# Patient Record
Sex: Female | Born: 1990 | Race: White | Hispanic: No | Marital: Married | Smoking: Never smoker
Health system: Southern US, Community
[De-identification: ages and names within clinical notes are randomized; demographics above are authoritative.]

## PROBLEM LIST (undated history)

## (undated) DIAGNOSIS — F419 Anxiety disorder, unspecified: Secondary | ICD-10-CM

## (undated) DIAGNOSIS — E559 Vitamin D deficiency, unspecified: Secondary | ICD-10-CM

## (undated) HISTORY — PX: OTHER SURGICAL HISTORY: SHX169

## (undated) HISTORY — DX: Vitamin D deficiency, unspecified: E55.9

---
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
## 2011-05-21 LAB — URINALYSIS, ROUTINE W REFLEX MICROSCOPIC
Bilirubin Urine: NEGATIVE
Glucose, UA: NEGATIVE
Ketones, ur: NEGATIVE
Leukocytes, UA: NEGATIVE
Nitrite: NEGATIVE
Protein, ur: NEGATIVE
pH: 7

## 2014-03-22 ENCOUNTER — Ambulatory Visit: Payer: Self-pay | Admitting: Cardiovascular Disease

## 2018-03-06 ENCOUNTER — Emergency Department (HOSPITAL_COMMUNITY): Payer: 59

## 2018-03-06 ENCOUNTER — Encounter (HOSPITAL_COMMUNITY): Payer: Self-pay | Admitting: Emergency Medicine

## 2018-03-06 ENCOUNTER — Other Ambulatory Visit: Payer: Self-pay

## 2018-03-06 ENCOUNTER — Emergency Department (HOSPITAL_COMMUNITY)
Admission: EM | Admit: 2018-03-06 | Discharge: 2018-03-06 | Disposition: A | Discharge status: Home / Self Care | Payer: 59 | Attending: Emergency Medicine | Admitting: Emergency Medicine

## 2018-03-06 DIAGNOSIS — F410 Panic disorder [episodic paroxysmal anxiety] without agoraphobia: Secondary | ICD-10-CM | POA: Insufficient documentation

## 2018-03-06 DIAGNOSIS — R0602 Shortness of breath: Secondary | ICD-10-CM | POA: Diagnosis present

## 2018-03-06 HISTORY — DX: Anxiety disorder, unspecified: F41.9

## 2018-03-06 LAB — CBC WITH DIFFERENTIAL/PLATELET
Abs Immature Granulocytes: 0.1 10*3/uL (ref 0.0–0.1)
BASOS ABS: 0.1 10*3/uL (ref 0.0–0.1)
Basophils Relative: 1 %
EOS PCT: 0 %
Eosinophils Absolute: 0 10*3/uL (ref 0.0–0.7)
HEMATOCRIT: 44.3 % (ref 36.0–46.0)
Hemoglobin: 14.6 g/dL (ref 12.0–15.0)
IMMATURE GRANULOCYTES: 1 %
Lymphocytes Relative: 10 %
Lymphs Abs: 1.3 10*3/uL (ref 0.7–4.0)
MCH: 29.1 pg (ref 26.0–34.0)
MCHC: 33 g/dL (ref 30.0–36.0)
MCV: 88.2 fL (ref 78.0–100.0)
MONOS PCT: 5 %
Monocytes Absolute: 0.7 10*3/uL (ref 0.1–1.0)
NEUTROS PCT: 83 %
Neutro Abs: 11.5 10*3/uL — ABNORMAL HIGH (ref 1.7–7.7)
Platelets: 246 10*3/uL (ref 150–400)
RBC: 5.02 MIL/uL (ref 3.87–5.11)
RDW: 12.6 % (ref 11.5–15.5)
WBC: 13.7 10*3/uL — AB (ref 4.0–10.5)

## 2018-03-06 LAB — BASIC METABOLIC PANEL
ANION GAP: 12 (ref 5–15)
BUN: 8 mg/dL (ref 6–20)
CALCIUM: 9.5 mg/dL (ref 8.9–10.3)
CO2: 20 mmol/L — AB (ref 22–32)
CREATININE: 0.73 mg/dL (ref 0.44–1.00)
Chloride: 106 mmol/L (ref 98–111)
Glucose, Bld: 106 mg/dL — ABNORMAL HIGH (ref 70–99)
Potassium: 3.9 mmol/L (ref 3.5–5.1)
SODIUM: 138 mmol/L (ref 135–145)

## 2018-03-06 MED ORDER — HYDROXYZINE HCL 25 MG PO TABS: 25.0000 mg | ORAL_TABLET | Freq: Three times a day (TID) | ORAL | 0 refills | Status: DC | PRN

## 2018-03-06 NOTE — ED Provider Notes (Signed)
[facility] EMERGENCY [facility]MENT Provider Note   CSN: [id number] Arrival date & time: 03/06/18  1346     History   Chief Complaint Chief Complaint  Patient presents with  . Anxiety    HPI Virginia Taylor is a 27 y.o. female presenting for evaluation of shortness of breath.  Patient states that she was eating breakfast this morning when she had acute onset shortness of breath, tachycardia/palpitations, and feeling like sounds in light was hypersensitive.  This lasted for about 2-1/2 minutes before she is able to get out of the restaurant, take if you did breaths and calm herself down.  All symptoms resolved at that moment.  Several hours later, she was driving her car when she had a similar episode, which lasted for approximately 5 minutes.  She had 2 subsequent episodes today prior to arrival.  She states that when she started hyperventilating, she had tingling of bilateral hands, starting in the pinkies.  Additionally, her hands cramped.  All symptoms resolved after several minutes of deep breaths.  She reports a history of anxiety, has had anxiety attacks in the past.  She has never had this many in 1 day.  She denies significant stressor or trigger.  She has no other medical problems, takes no medications daily.  She denies recent travel, surgeries, immobilization, history of cancer, history of PE/dvt, or estrogen use.  She does not smoke cigarettes, denies drug use.  Reports occasional/social alcohol use.  She does not have a psychologist/psychiatrist, however her mom works in [employer] [place] field and wants her to see somebody.  She does not have a medical doctor.  She denies chest pain during these episodes.  She denies fevers, chills, cough, nausea, vomiting, abdominal pain, urinary symptoms, abnormal bowel movements, leg pain or swelling.  She denies cardiac history, history of hypertension, diabetes, or hyperlipidemia. She is currently sx free.  Symptoms are not  exacerbated with exertion.  HPI  Past Medical History:  Diagnosis Date  . Anxiety     There are no active problems to display for this patient.   History reviewed. No pertinent surgical history.   OB History   None      Home Medications    Prior to Admission medications   Medication Sig Start Date End Date Taking? Authorizing Provider  hydrOXYzine (ATARAX/VISTARIL) 25 MG tablet Take 1 tablet (25 mg total) by mouth every 8 (eight) hours as needed. 03/06/18   Czar Ysaguirre, PA-C    Family History No family history on file.  Social History Social History   Tobacco Use  . Smoking status: Not on file  Substance Use Topics  . Alcohol use: Not on file  . Drug use: Not on file     Allergies   Penicillins   Review of Systems Review of Systems  Respiratory: Positive for shortness of breath (resolved). Negative for chest tightness.   Cardiovascular: Positive for palpitations. Negative for chest pain.  Neurological:       Tingling of bilateral hands, resolved  All other systems reviewed and are negative.    Physical Exam Updated Vital Signs BP 125/84   Pulse 86   Temp 98.4 F (36.9 C) (Oral)   Resp 16   Ht 5\' 10"  (1.778 m)   Wt 52.2 kg (115 lb)   LMP 02/19/2018 (Within Days)   SpO2 100%   BMI 16.50 kg/m   Physical Exam  Constitutional: She is oriented to person, place, and time. She appears well-developed  and well-nourished. No distress.  Young appearing female in no apparent distress.  HENT:  Head: Normocephalic and atraumatic.  MM moist.  Eyes: Pupils are equal, round, and reactive to light. Conjunctivae and EOM are normal.  Neck: Normal range of motion. Neck supple.  Cardiovascular: Normal rate, regular rhythm and intact distal pulses.  Pulmonary/Chest: Effort normal and breath sounds normal. No respiratory distress. She has no wheezes. She exhibits no tenderness.  Speaking in full sentences.  Clear lung sounds in all fields.  Abdominal: Soft.  She exhibits no distension and no mass. There is no tenderness. There is no guarding.  Musculoskeletal: Normal range of motion.  No leg pain or swelling.  Radial and pedal pulses intact bilaterally.  Neurological: She is alert and oriented to person, place, and time.  Skin: Skin is warm and dry. Capillary refill takes less than 2 seconds.  Psychiatric: Her speech is normal and behavior is normal. Her mood appears anxious. She expresses no homicidal and no suicidal ideation. She expresses no suicidal plans and no homicidal plans.  Patient cooperative.  Asking many questions, appears anxious.  Nursing note and vitals reviewed.    ED Treatments / Results  Labs (all labs ordered are listed, but only abnormal results are displayed) Labs Reviewed  CBC WITH DIFFERENTIAL/PLATELET - Abnormal; Notable for the following components:      Result Value   WBC 13.7 (*)    Neutro Abs 11.5 (*)    All other components within normal limits  BASIC METABOLIC PANEL - Abnormal; Notable for the following components:   CO2 20 (*)    Glucose, Bld 106 (*)    All other components within normal limits    EKG EKG Interpretation  Date/Time:  Thursday March 06 2018 14:55:28 EDT Ventricular Rate:  94 PR Interval:  108 QRS Duration: 96 QT Interval:  366 QTC Calculation: 457 R Axis:   86 Text Interpretation:  Sinus rhythm with short PR ST & T wave abnormality, consider inferior ischemia Abnormal ECG LVH pattern. no old comparison. Confirmed by Arby Barrette [phone]) on 03/06/2018 5:52:17 PM   Radiology Dg Chest 2 View  Result Date: 03/06/2018 CLINICAL DATA:  Shortness of breath since this morning. EXAM: CHEST - 2 VIEW COMPARISON:  September 10, 2008 FINDINGS: The heart size and mediastinal contours are within normal limits. Both lungs are clear. Scoliosis of spine is noted. IMPRESSION: No active cardiopulmonary disease. Electronically Signed   By: Sherian Rein M.D.   On: 03/06/2018 18:53     Procedures Procedures (including critical care time)  Medications Ordered in ED Medications - No data to display   Initial Impression / Assessment and Plan / ED Course  I have reviewed the triage vital signs and the nursing notes.  Pertinent labs & imaging results that were available during my care of the patient were reviewed by me and considered in my medical decision making (see chart for details).     Patient presenting for evaluation of multiple episodes of shortness of breath, anxiety, palpitations, and hand tingling.  Currently without symptoms.  Physical exam reassuring, she is afebrile not tachycardic.  Appears nontoxic.  PERC negative.  Low risk/doubt ACS/PE.  History of anxiety, symptoms are consistent with anxiety attack.  Labs reassuring, white count mildly elevated, doubt infection.  EKG abnormal without obvious STEMI.  Discussed with attending, Dr. Clarice Pole agrees to plan.  Will obtain chest x-ray to check for cardiomegaly.  If negative, plan for discharge.  X-ray viewed and interpreted by  me, no pneumonia, pneumothorax, effusions, or cardiomegaly.  Symptoms likely due to anxiety.  Resources given.  Vistaril as needed for anxiety.  At this time, patient appears safe for discharge.  Return precautions given.  Patient states she understands and agrees plan.  Final Clinical Impressions(s) / ED Diagnoses   Final diagnoses:  Anxiety attack    ED Discharge Orders        Ordered    hydrOXYzine (ATARAX/VISTARIL) 25 MG tablet  Every 8 hours PRN     03/06/18 1857       Alveria Apley, PA-C 03/06/18 1901    Arby Barrette, MD 03/22/18 1546

## 2018-03-06 NOTE — Discharge Instructions (Addendum)
You may try Vistaril as needed for anxiety. It is important that you follow-up with a counselor to discuss your symptoms. Return to the emergency room if you develop any new, worsening, or concerning symptoms.

## 2018-03-06 NOTE — ED Provider Notes (Signed)
Patient placed in Quick Look pathway, seen and evaluated   Chief Complaint: anxiety  HPI:   Virginia Taylor is a 27 y.o. female with hx of what she has thought to be anxiety attacks but never dx who presents to the ED with numbness and tingling of her fingers. Patient  states she believes she had  Multiple panic attacks this morning while eating breakfast. States she was seated at a table when she became hypersensitive to sound, hyperventilated, and felt tingling in her hands and fingers. States that after the first few episodes she states with repeat episodes reports her hands started cramping and she felt like she could not open her hands. Patient reports that [place] [facility] [place] [facility] [place] [facility] [place] and her heart started pounding and she was talking to her mother and the symptoms got worse so she came to the ED.  ROS: Psych: anxious  Neuro: tingling in fingers  Heart: palpations  Physical Exam:  BP 125/84 (BP Location: Right Arm)   Pulse 87   Temp 98.4 F (36.9 C) (Oral)   Resp 16   SpO2 100%    Gen: No distress  Neuro: Awake and Alert  Skin: Warm and dry  Psych: anxious, crying  Heart: tachycardia       Initiation of care has begun. The patient has been counseled on the process, plan, and necessity for staying for the completion/evaluation, and the remainder of the medical screening examination    Janne Napoleoneese, Haiden Clucas M, NP 03/06/18 1453    Gerhard MunchLockwood, Robert, MD 03/06/18 [phone]

## 2018-03-06 NOTE — ED Notes (Signed)
Patient  called for triage, no answer  x1 

## 2018-03-06 NOTE — ED Triage Notes (Signed)
Patient with history of anxiety states she believes she had  Multiple panic attacks this morning while eating breakfast. States she was seated at a table when she became hypersensitive to sound, hyperventilated, and felt tingling in her hands and fingers. States that after the first few episodes she states with repeat episodes reports her hands started cramping and she felt like she could not open her hands. Denies history of panic attacks. Patient alert, oriented, and in no apparent distress at this time.

## 2018-03-07 ENCOUNTER — Ambulatory Visit: Payer: 59 | Admitting: Psychology

## 2018-03-07 DIAGNOSIS — F411 Generalized anxiety disorder: Secondary | ICD-10-CM | POA: Diagnosis not present

## 2018-03-12 ENCOUNTER — Ambulatory Visit: Payer: 59 | Admitting: Psychology

## 2018-03-12 DIAGNOSIS — F411 Generalized anxiety disorder: Secondary | ICD-10-CM

## 2018-03-20 ENCOUNTER — Ambulatory Visit: Payer: 59 | Admitting: Psychology

## 2018-04-02 ENCOUNTER — Ambulatory Visit: Payer: 59 | Admitting: Psychology

## 2018-04-02 DIAGNOSIS — F411 Generalized anxiety disorder: Secondary | ICD-10-CM | POA: Diagnosis not present

## 2018-05-07 ENCOUNTER — Ambulatory Visit: Payer: 59 | Admitting: Psychology

## 2018-09-05 ENCOUNTER — Encounter: Payer: Self-pay | Admitting: Neurology

## 2018-09-20 NOTE — Progress Notes (Signed)
[facility] Clinic Note - Initial Visit   Date: 09/22/18  Virginia Taylor MRN: [id number] DOB: 12/28/1990   Dear Milta DeitersLeslie Arledge, FNP:  Thank you for your kind referral of Virginia Taylor for consultation of generalized weakness. Although her history is well known to you, please allow us to reiterate it for the purpose of our medical record. The patient was accompanied to the clinic by self.    History of Present Illness: Virginia Taylor is a 28 y.o. right-handed Caucasian female with panic attacks and chronic fatigue presenting for evaluation of generalized weakness.    For several years, she has been struggling with chronic fatigue and generalized body aches.  She went for a second opinion and was found to have vitamin D deficiency.  Around early January 2019, she developed one-day spell of acute worsening of generalized weakness/achiness over the legs and then into the rest of her body.  She felt unwell, as if she had the flu, which kept her in bed all day.  Symptoms resolved with tylenol and she was able to be functional again by the next day. She recalls having a viral infection one week prior to this.  There was no associated numbness/tingling, vision changes, headaches, or limb weakness.    Out-side paper records, electronic medical record, and images have been reviewed where available and summarized as:  Labs 08/26/2018:  TSH, lipid panel, CMP, CBC is normal  Past Medical History:  Diagnosis Date  . Anxiety   . Vitamin D deficiency     Past Surgical History:  Procedure Laterality Date  . None       Medications:  Outpatient Encounter Medications as of 09/22/2018  Medication Sig  . albuterol (PROVENTIL HFA;VENTOLIN HFA) 108 (90 Base) MCG/ACT inhaler   . benzonatate (TESSALON) 100 MG capsule   . Vitamin D, Ergocalciferol, (DRISDOL) 1.25 MG (50000 UT) CAPS capsule   . [DISCONTINUED] hydrOXYzine (ATARAX/VISTARIL) 25 MG tablet Take 1 tablet (25  mg total) by mouth every 8 (eight) hours as needed.   No facility-administered encounter medications on file as of 09/22/2018.      Allergies:  Allergies  Allergen Reactions  . Penicillins Other (See Comments)    Has patient had a PCN reaction causing immediate rash, facial/tongue/throat swelling, SOB or lightheadedness with hypotension: Unknown Has patient had a PCN reaction causing severe rash involving mucus membranes or skin necrosis: Unknown Has patient had a PCN reaction that required hospitalization: Unknown Has patient had a PCN reaction occurring within the last 10 years: No If all of the above answers are "NO", then may proceed with Cephalosporin use.     Family History: Family History  Problem Relation Age of Onset  . Migraines Mother   . Other Father        Accident at work  . Lung cancer Maternal Grandmother   . Kidney cancer Maternal Grandmother   . Heart attack Maternal Grandfather     Social History: Social History   Tobacco Use  . Smoking status: Never Smoker  . Smokeless tobacco: Never Used  Substance Use Topics  . Alcohol use: Never    Frequency: Never  . Drug use: Not on file   Social History   Social History Narrative   Lives with fiancee in an apartment on the 2nd floor.  No children.  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     Education: some college, [employer]     Review of Systems:  CONSTITUTIONAL: No fevers,  chills, night sweats, or weight loss.   EYES: No visual changes or eye pain ENT: No hearing changes.  No history of nose bleeds.   RESPIRATORY: No cough, wheezing and shortness of breath.   CARDIOVASCULAR: Negative for chest pain, and palpitations.   GI: Negative for abdominal discomfort, blood in stools or black stools.  No recent change in bowel habits.   GU:  No history of incontinence.   MUSCLOSKELETAL: No history of joint pain or swelling.  No myalgias.   SKIN: Negative for lesions, rash, and itching.   HEMATOLOGY/ONCOLOGY:  Negative for prolonged bleeding, bruising easily, and swollen nodes.  No history of cancer.   ENDOCRINE: Negative for cold or heat intolerance, polydipsia or goiter.   PSYCH:  No depression or anxiety symptoms.   NEURO: As Above.   Vital Signs:  BP 110/84   Pulse (!) 118   Ht 5' 9.5" (1.765 m)   Wt 118 lb (53.5 kg)   SpO2 98%   BMI 17.18 kg/m    General Medical Exam:   General:  Well appearing, comfortable.   Eyes/ENT: see cranial nerve examination.   Neck: No masses appreciated.  Full range of motion without tenderness.  No carotid bruits. Respiratory:  Clear to auscultation, good air entry bilaterally.   Cardiac:  Regular rate and rhythm, no murmur.   Extremities:  No deformities, edema, or skin discoloration.  Skin:  No rashes or lesions.  Neurological Exam: MENTAL STATUS including orientation to time, place, person, recent and remote memory, attention span and concentration, language, and fund of knowledge is normal.  Speech is not dysarthric.  CRANIAL NERVES: II:  No visual field defects.  Unremarkable fundi.   III-IV-VI: Pupils equal round and reactive to light.  Normal conjugate, extra-ocular eye movements in all directions of gaze.  No nystagmus.  No ptosis.   V:  Normal facial sensation.     VII:  Normal facial symmetry and movements.  VIII:  Normal hearing and vestibular function.   IX-X:  Normal palatal movement.   XI:  Normal shoulder shrug and head rotation.   XII:  Normal tongue strength and range of motion, no deviation or fasciculation.  MOTOR:  No atrophy, fasciculations or abnormal movements.  No pronator drift.  Tone is normal.    Right Upper Extremity:    Left Upper Extremity:    Deltoid  5/5   Deltoid  5/5   Biceps  5/5   Biceps  5/5   Triceps  5/5   Triceps  5/5   Wrist extensors  5/5   Wrist extensors  5/5   Wrist flexors  5/5   Wrist flexors  5/5   Finger extensors  5/5   Finger extensors  5/5   Finger flexors  5/5   Finger flexors  5/5   Dorsal  interossei  5/5   Dorsal interossei  5/5   Abductor pollicis  5/5   Abductor pollicis  5/5   Tone (Ashworth scale)  0  Tone (Ashworth scale)  0   Right Lower Extremity:    Left Lower Extremity:    Hip flexors  5/5   Hip flexors  5/5   Hip extensors  5/5   Hip extensors  5/5   Knee flexors  5/5   Knee flexors  5/5   Knee extensors  5/5   Knee extensors  5/5   Dorsiflexors  5/5   Dorsiflexors  5/5   Plantarflexors  5/5   Plantarflexors  5/5  Toe extensors  5/5   Toe extensors  5/5   Toe flexors  5/5   Toe flexors  5/5   Tone (Ashworth scale)  0  Tone (Ashworth scale)  0   MSRs:  Right                                                                 Left brachioradialis 2+  brachioradialis 2+  biceps 2+  biceps 2+  triceps 2+  triceps 2+  patellar 2+  patellar 2+  ankle jerk 2+  ankle jerk 2+  Hoffman no  Hoffman no  plantar response down  plantar response down   SENSORY:  Normal and symmetric perception of light touch, pinprick, vibration, and proprioception.    COORDINATION/GAIT: Normal finger-to- nose-finger.  Intact rapid alternating movements bilaterally.  Able to rise from a chair without using arms.  Easily able to perform deep knee squat and stand up without difficulty. Gait narrow based and stable. Tandem and stressed gait intact.    IMPRESSION: Chronic fatigue syndrome and fibromyalgia.  Patient reassured that there are no signs of neurological condition contributing to her symptoms of fatigue and weakness.  With a normal exam and benign history, no further testing is indicated.  Acute worsening of myalgias and weakness is most likely related to post-viral syndrome.    Thank you for allowing me to participate in patient's care.  If I can answer any additional questions, I would be pleased to do so.    Sincerely,     K. Allena KatzPatel, DO

## 2018-09-22 ENCOUNTER — Ambulatory Visit: Payer: 59 | Admitting: Neurology

## 2018-09-22 ENCOUNTER — Encounter: Payer: Self-pay | Admitting: Neurology

## 2018-09-22 VITALS — BP 110/84 | HR 118 | Ht 69.5 in | Wt 118.0 lb

## 2018-09-22 DIAGNOSIS — G933 Postviral fatigue syndrome: Secondary | ICD-10-CM | POA: Diagnosis not present

## 2018-09-22 DIAGNOSIS — M791 Myalgia, unspecified site: Secondary | ICD-10-CM

## 2018-09-22 DIAGNOSIS — G9331 Postviral fatigue syndrome: Secondary | ICD-10-CM

## 2018-09-22 NOTE — Patient Instructions (Addendum)
Great news - your neurological exam is entirely normal.  No signs of neurological condition causing any of your weakness or pain.   The physicians and staff at [facility] are committed to providing excellent care. You may receive a survey requesting feedback about your experience at our office. We strive to receive "very good" responses to the survey questions. If you feel that your experience would prevent you from giving the office a "very good " response, please contact our office to try to remedy the situation. We may be reached at [phone]. Thank you for taking the time out of your busy day to complete the survey.

## 2020-06-02 IMAGING — CR DG CHEST 2V
2 series · 2 of 2 positions shown · non-contrast
Comparison: September 10, 2008

CLINICAL DATA: Shortness of breath since this morning.

EXAM:
CHEST - 2 VIEW

[chest pa]
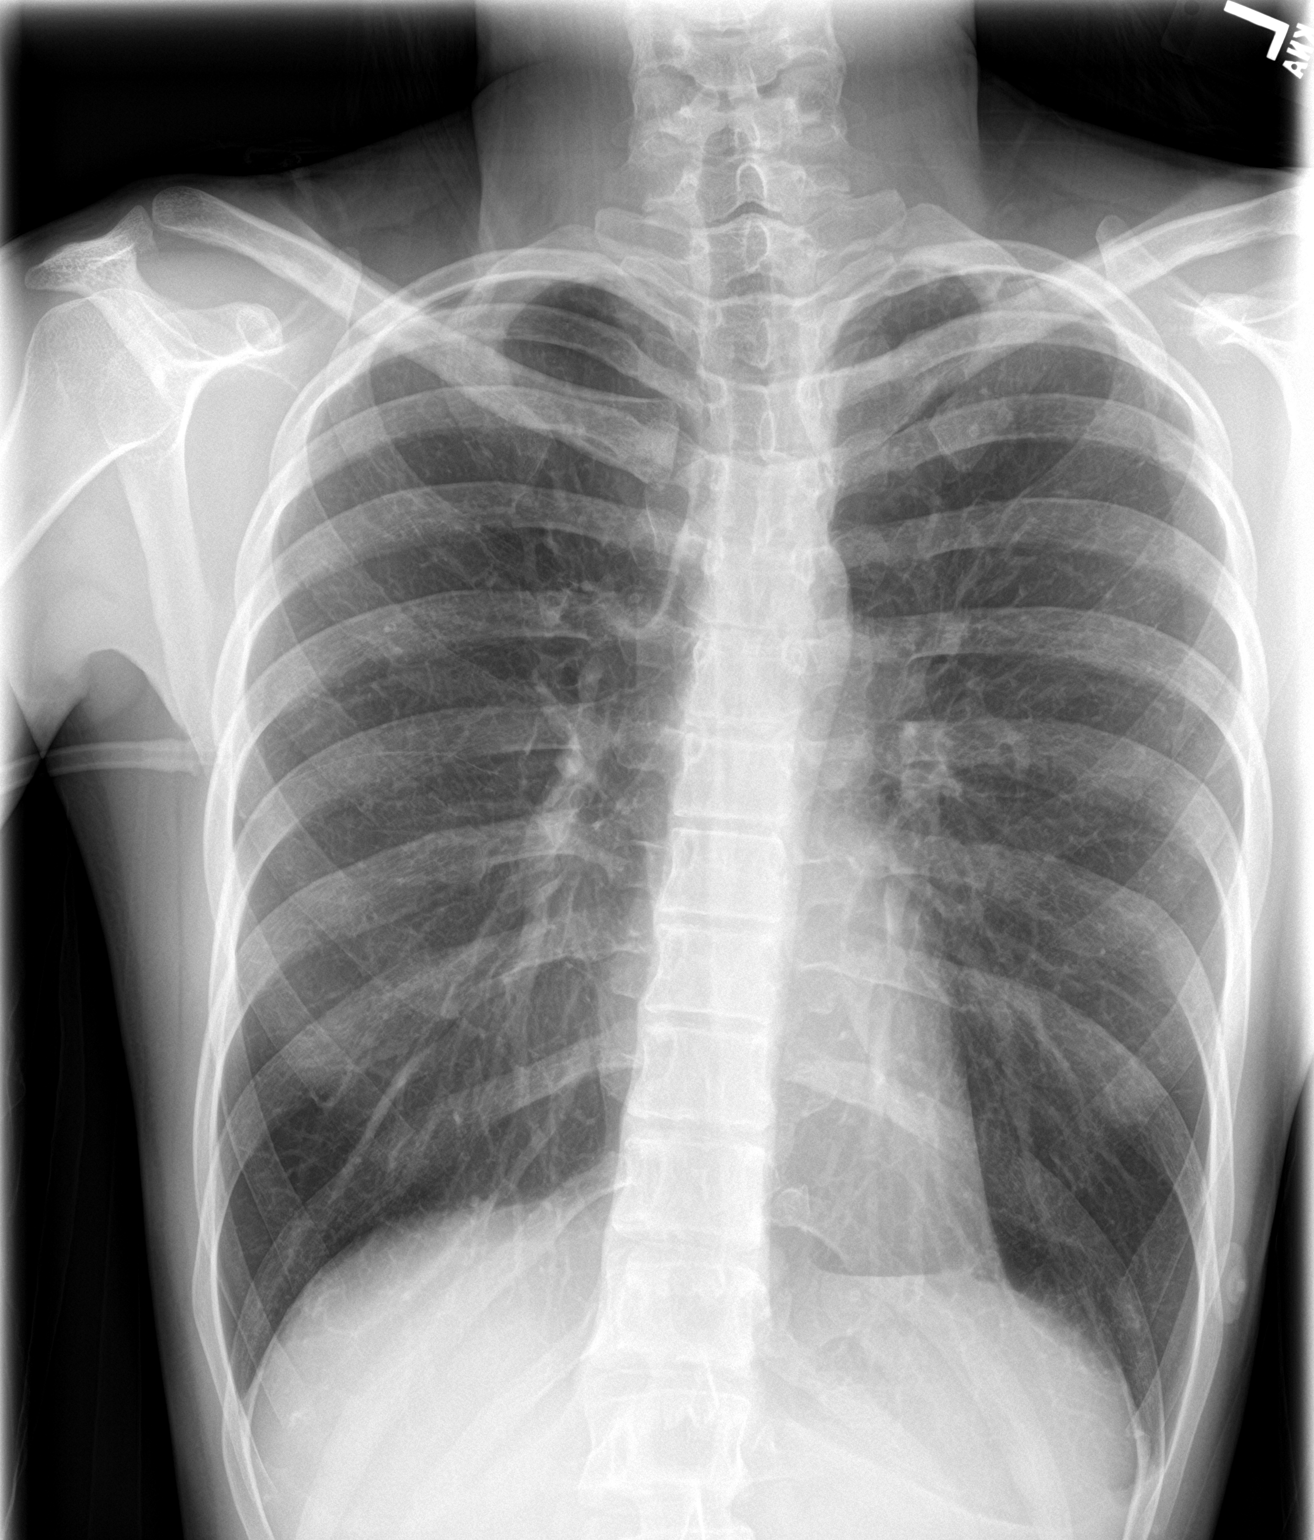

[chest lat]
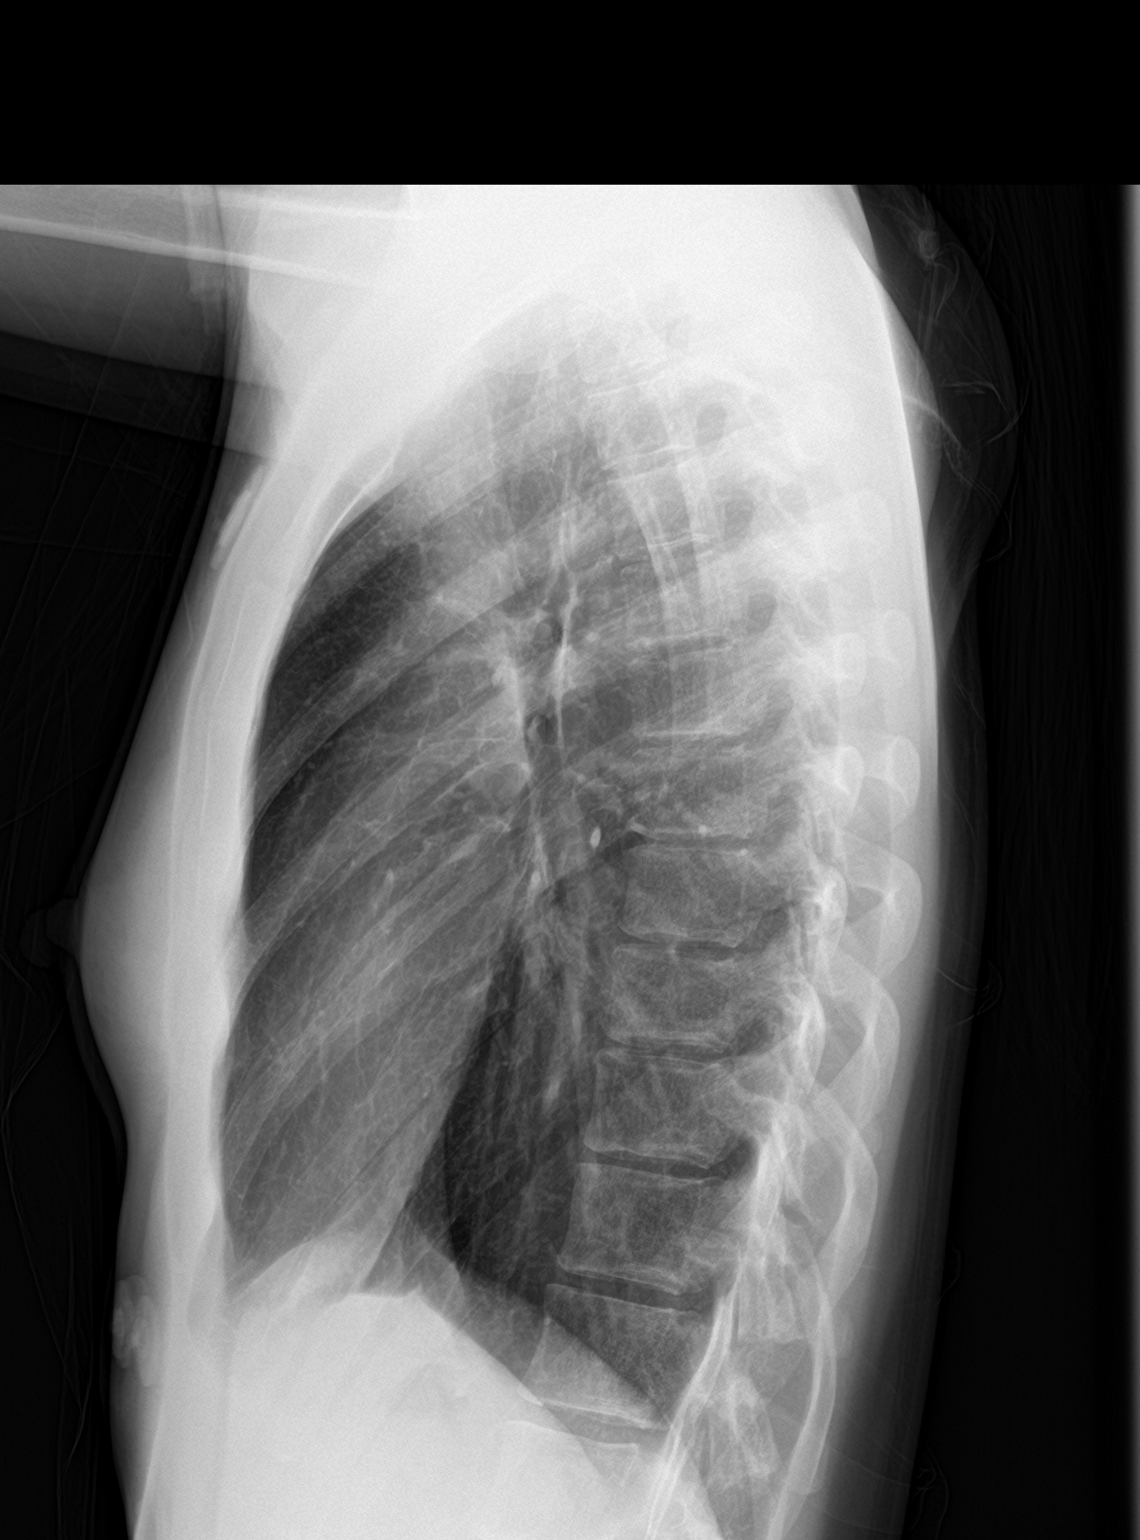

[2 of 2 positions shown; findings below may reference images not displayed]

FINDINGS: The heart size and mediastinal contours are within normal limits.
Both lungs are clear. Scoliosis of spine is noted.
IMPRESSION: No active cardiopulmonary disease.

## 2020-12-01 ENCOUNTER — Other Ambulatory Visit: Payer: Self-pay

## 2020-12-01 ENCOUNTER — Encounter (HOSPITAL_COMMUNITY): Payer: Self-pay | Admitting: Emergency Medicine

## 2020-12-01 ENCOUNTER — Emergency Department (HOSPITAL_COMMUNITY)
Admission: EM | Admit: 2020-12-01 | Discharge: 2020-12-01 | Disposition: A | Discharge status: Home / Self Care | Payer: BLUE CROSS/BLUE SHIELD | Attending: Emergency Medicine | Admitting: Emergency Medicine

## 2020-12-01 DIAGNOSIS — W07XXXA Fall from chair, initial encounter: Secondary | ICD-10-CM | POA: Diagnosis not present

## 2020-12-01 DIAGNOSIS — Y990 Civilian activity done for income or pay: Secondary | ICD-10-CM | POA: Diagnosis not present

## 2020-12-01 DIAGNOSIS — S0181XA Laceration without foreign body of other part of head, initial encounter: Secondary | ICD-10-CM | POA: Diagnosis not present

## 2020-12-01 DIAGNOSIS — W19XXXA Unspecified fall, initial encounter: Secondary | ICD-10-CM

## 2020-12-01 DIAGNOSIS — M79644 Pain in right finger(s): Secondary | ICD-10-CM | POA: Insufficient documentation

## 2020-12-01 DIAGNOSIS — S0993XA Unspecified injury of face, initial encounter: Secondary | ICD-10-CM | POA: Diagnosis present

## 2020-12-01 NOTE — Discharge Instructions (Addendum)
Please keep the wound clean and dry.  You may use buddy tape for your fingers.  Please make sure to receive your tetanus shot.  You may experience signs of a concussion, please see the handout for additional information.  Please return to the ER for worsening vomiting, blurry vision, confusion, sleepiness, or any other new or concerning symptoms.  Please make sure to follow-up with your primary care doctor.

## 2020-12-01 NOTE — ED Triage Notes (Signed)
Pt c/o headache after falling off a chair while standing on it this morning. Denies LOC, GCS 15 at this time, lac to chin, also c/o right fifth finger pain.

## 2020-12-01 NOTE — ED Provider Notes (Signed)
[facility] EMERGENCY DEPARTMENT Provider Note   CSN: [id number] Arrival date & time: 12/01/20  1548     History Chief Complaint  Patient presents with  . Fall    Virginia Taylor is a 30 y.o. female.  HPI 30 year old female with a history of anxiety presents to the ER after a fall.  Patient was standing up at work on a swivel chair and lost her balance and fell forward.  She presents with a small laceration under her chin.  She endorses some head tingling, but no vision changes, nausea, vomiting.  Denies any LOC.  She not on blood thinners.  She also feels like she might have jammed her right pinky.    Past Medical History:  Diagnosis Date  . Anxiety   . Vitamin D deficiency     There are no problems to display for this patient.   Past Surgical History:  Procedure Laterality Date  . None       OB History   No obstetric history on file.     Family History  Problem Relation Age of Onset  . Migraines Mother   . Other Father        Accident at work  . Lung cancer Maternal Grandmother   . Kidney cancer Maternal Grandmother   . Heart attack Maternal Grandfather     Social History   Tobacco Use  . Smoking status: Never Smoker  . Smokeless tobacco: Never Used  Vaping Use  . Vaping Use: Never used  Substance Use Topics  . Alcohol use: Never    Home Medications Prior to Admission medications   Medication Sig Start Date End Date Taking? Authorizing Provider  albuterol (PROVENTIL HFA;VENTOLIN HFA) 108 (90 Base) MCG/ACT inhaler  09/17/18   [provider]  benzonatate (TESSALON) 100 MG capsule  09/17/18   [provider]  Vitamin D, Ergocalciferol, (DRISDOL) 1.25 MG (50000 UT) CAPS capsule  09/01/18   [provider]    Allergies    Penicillins  Review of Systems   Review of Systems  Musculoskeletal: Positive for arthralgias.  Skin: Positive for color change and wound.  Neurological: Positive for headaches.  Negative for weakness and numbness.    Physical Exam Updated Vital Signs BP 126/88 (BP Location: Right Arm)   Pulse 95   Temp 98.6 F (37 C) (Oral)   Resp 16   LMP 10/31/2020   SpO2 100%   Physical Exam Vitals and nursing note reviewed.  Constitutional:      General: She is not in acute distress.    Appearance: She is well-developed.  HENT:     Head: Normocephalic and atraumatic.     Comments: No of hemotympanum, raccoon eyes, battle sign.  No mastoid tenderness.  No malocclusion.  No evidence of lacerations, cranial deformities. Full range of motion of head and neck   Eyes:     Conjunctiva/sclera: Conjunctivae normal.  Cardiovascular:     Rate and Rhythm: Normal rate and regular rhythm.     Heart sounds: No murmur heard.   Pulmonary:     Effort: Pulmonary effort is normal. No respiratory distress.     Breath sounds: Normal breath sounds.  Abdominal:     Palpations: Abdomen is soft.     Tenderness: There is no abdominal tenderness.  Musculoskeletal:     Cervical back: Neck supple.     Comments: Right pinky with full range of motion including flexion extension of the DIP and PIP joint.  Mild swelling noted.  No open skin.  Neurovascularly intact.No C, T, L-spine tenderness.  5/5 strength in upper and lower extremities.  No noticeable step-offs, crepitus, fluctuance, erythema.  Sensations intact.  Full range of motion and strength of neck. Moving all 4 extremities without difficulty.    Skin:    General: Skin is warm and dry.     Comments: Very small half centimeter laceration under chin.  Bleeding controlled.  Neurological:     Mental Status: She is alert.     ED Results / Procedures / Treatments   Labs (all labs ordered are listed, but only abnormal results are displayed) Labs Reviewed - No data to display  EKG None  Radiology No results found.  Procedures Procedures   Medications Ordered in ED Medications - No data to display  ED Course  I have  reviewed the triage vital signs and the nursing notes.  Pertinent labs & imaging results that were available during my care of the patient were reviewed by me and considered in my medical decision making (see chart for details).    MDM Rules/Calculators/A&P                         30 year old female presents after a fall.  Has a small laceration under her chin, we had a joint decision-making discussion and patient does not want to have stitches at this time.  Laceration is very small, I think this is reasonable.  Right pinky with full flexion extension of the DIP and PIP joint, suspect possible sprain but no evidence of fracture.  Patient denies any concerning signs or head injury including nausea, vomit, blurry vision.  Low suspicion for intracranial bleed.  Patient is unclear of last tetanus vaccination, however currently refusing this here today.  She understands the risks of not receiving a tetanus shot.  I did educate her on possible concussion symptoms.  We discussed return precautions.  She voiced understanding and is agreeable.  Final Clinical Impression(s) / ED Diagnoses Final diagnoses:  Fall, initial encounter  Chin laceration, initial encounter  Finger pain, right    Rx / DC Orders ED Discharge Orders    None       Mare Ferrari, PA-C 12/01/20 1622    Horton, Clabe Seal, DO 12/01/20 1958

## 2023-04-01 ENCOUNTER — Ambulatory Visit: Payer: 59 | Admitting: Podiatry

## 2023-04-01 DIAGNOSIS — B070 Plantar wart: Secondary | ICD-10-CM

## 2023-04-01 NOTE — Progress Notes (Unsigned)
Subjective:   Patient ID: Virginia Taylor, female   DOB: 32 y.o.   MRN: [id number]   HPI Chief Complaint  Patient presents with   Callouses    RIGHT HALLUX, CORN/CALLUS/ WART, BEEN THERE FOR 2 MONTHS, BEEN USING THE CORN PATCHES FOR ON AND OFF FOR 2 MONTHS, PAIN LEVEL 5/10. DENIES DRAINAGE.   No injuries. Works on [employer]. No drinage or bleeding.    ROS      Objective:  Physical Exam  ***     Assessment:  ***     Plan:  ***

## 2023-04-01 NOTE — Patient Instructions (Signed)
Take dressing off in 8 hours and wash the foot with soap and water. If it is hurting or becomes uncomfortable before the 8 hours, go ahead and remove the bandage and wash the area.  If it blisters, apply antibiotic ointment and a band-aid.  Monitor for any signs/symptoms of infection. Call the office immediately if any occur or go directly to the emergency room. Call with any questions/concerns.   

## 2023-04-03 ENCOUNTER — Encounter: Payer: Self-pay | Admitting: Podiatry

## 2023-04-03 ENCOUNTER — Telehealth: Payer: Self-pay | Admitting: Podiatry

## 2023-04-03 NOTE — Telephone Encounter (Signed)
Pt has concerns about the wart removal pt can not put pressure on that foot and may need a note for work pt works 12hrs standing, pt is in lots of pain, please contact pt. [phone] lakayla this is the pts Wife

## 2023-04-29 ENCOUNTER — Ambulatory Visit: Payer: 59 | Admitting: Podiatry
